# Patient Record
Sex: Female | Born: 1975 | Race: White | Hispanic: No | Marital: Married | Smoking: Former smoker
Health system: Southern US, Community
[De-identification: ages and names within clinical notes are randomized; demographics above are authoritative.]

## PROBLEM LIST (undated history)

## (undated) DIAGNOSIS — F419 Anxiety disorder, unspecified: Secondary | ICD-10-CM

## (undated) DIAGNOSIS — F988 Other specified behavioral and emotional disorders with onset usually occurring in childhood and adolescence: Secondary | ICD-10-CM

## (undated) HISTORY — DX: Anxiety disorder, unspecified: F41.9

## (undated) HISTORY — PX: OTHER SURGICAL HISTORY: SHX169

## (undated) HISTORY — DX: Other specified behavioral and emotional disorders with onset usually occurring in childhood and adolescence: F98.8

---

## 2009-01-21 ENCOUNTER — Encounter: Admission: RE | Admit: 2009-01-21 | Discharge: 2009-01-21 | Payer: Self-pay | Admitting: Family Medicine

## 2013-02-08 ENCOUNTER — Other Ambulatory Visit (HOSPITAL_COMMUNITY)
Admission: RE | Admit: 2013-02-08 | Discharge: 2013-02-08 | Disposition: A | Discharge status: Home / Self Care | Payer: BC Managed Care – PPO | Source: Ambulatory Visit | Attending: Internal Medicine | Admitting: Internal Medicine

## 2013-02-08 DIAGNOSIS — Z01419 Encounter for gynecological examination (general) (routine) without abnormal findings: Secondary | ICD-10-CM | POA: Insufficient documentation

## 2014-01-29 ENCOUNTER — Ambulatory Visit (INDEPENDENT_AMBULATORY_CARE_PROVIDER_SITE_OTHER): Payer: BC Managed Care – PPO | Admitting: Physician Assistant

## 2014-01-29 ENCOUNTER — Ambulatory Visit: Payer: BC Managed Care – PPO

## 2014-01-29 VITALS — BP 124/80 | HR 64 | Temp 98.5°F | Resp 16 | Ht 61.25 in | Wt 111.8 lb

## 2014-01-29 DIAGNOSIS — S61419A Laceration without foreign body of unspecified hand, initial encounter: Secondary | ICD-10-CM

## 2014-01-29 DIAGNOSIS — M79609 Pain in unspecified limb: Secondary | ICD-10-CM

## 2014-01-29 DIAGNOSIS — Z23 Encounter for immunization: Secondary | ICD-10-CM

## 2014-01-29 DIAGNOSIS — S61219A Laceration without foreign body of unspecified finger without damage to nail, initial encounter: Secondary | ICD-10-CM

## 2014-01-29 DIAGNOSIS — S61409A Unspecified open wound of unspecified hand, initial encounter: Secondary | ICD-10-CM

## 2014-01-29 DIAGNOSIS — M79643 Pain in unspecified hand: Secondary | ICD-10-CM

## 2014-01-29 MED ORDER — CEPHALEXIN 500 MG PO CAPS: 500.0000 mg | ORAL_CAPSULE | Freq: Three times a day (TID) | ORAL | Status: DC

## 2014-01-29 MED ORDER — TRAMADOL HCL 50 MG PO TABS
50.0000 mg | ORAL_TABLET | Freq: Three times a day (TID) | ORAL | Status: DC | PRN
Start: 2014-01-29 — End: 2021-02-16

## 2014-01-29 NOTE — Progress Notes (Signed)
Subjective:    Patient ID: Sheri Travis, female    DOB: 12/01/1976, 38 y.o.   MRN: [id number]  HPI Primary Physician: No primary provider on file.  Chief Complaint: Laceration left hand  HPI: 38 y.o. female with history below presents with multiple lacerations to the left hand. Patient was home brewing around 2:30 PM this afternoon when she slightly stumbled forward causing the glass to hit a counter and shatter. Upon this happening she suffered multiple lacerations along the left hand/digits. She originally thought she could get by without having to have sutures placed. She also considered placing glue on the wounds herself, however she could not get the wound on her on her 5th digit to stop bleeding prompting her to come in for evaluation. She did dress the wounds herself quite well. She is uncertain when her last tetanus vaccine was.    History reviewed. No pertinent past medical history.   Home Meds: Prior to Admission medications   Medication Sig Start Date End Date Taking? Authorizing Provider  methylphenidate (CONCERTA) 27 MG CR tablet Take 27 mg by mouth every morning.   Yes Historical Provider, MD    Allergies: No Known Allergies  History   Social History  . Marital Status: Married    Spouse Name: N/A    Number of Children: N/A  . Years of Education: N/A   Occupational History  . Not on file.   Social History Main Topics  . Smoking status: Current Some Day Smoker  . Smokeless tobacco: Not on file  . Alcohol Use: 1.8 oz/week    3 Glasses of wine per week  . Drug Use: No  . Sexual Activity: Yes   Other Topics Concern  . Not on file   Social History Narrative  . No narrative on file     Review of Systems  Constitutional: Negative for fatigue.  Skin: Positive for wound. Negative for color change, pallor and rash.       Objective:   Physical Exam  Physical Exam: Blood pressure 124/80, pulse 64, temperature 98.5 F (36.9 C), temperature source  Oral, resp. rate 16, height 5' 1.25" (1.556 m), weight 111 lb 12.8 oz (50.712 kg), last menstrual period 01/14/2014, SpO2 100.00%., Body mass index is 20.95 kg/(m^2). General: Well developed, well nourished, in no acute distress. Head: Normocephalic, atraumatic, eyes without discharge, sclera non-icteric, nares are without discharge.    Neck: Supple. Full ROM.  Lungs: Breathing is unlabored. Heart: Regular rate. Msk:  Strength and tone normal for age. Extremities/Skin: Warm and dry. No clubbing or cyanosis. No edema. No rashes. Left hand: multiple lacerations. Along the lateral palmer surface there is a 2 cm superficial laceration. Along the distal aspect of the 3rd digit there is a 1 cm superficial laceration. Along the extensor surface of the 3rd digit there is a 1 cm superficial laceration. Along the base of the 5th digit on the flexor surface there is a 0.5 cm superficial laceration. 3rd digit: 2 cm laceration that is a flap. Wound starts midline at the base of the 3rd digit and extends laterally towards the PIP. 5th digit: 1 cm laceration slightly proximal to the nail on the extensor surface. There is a 2 cm laceration along the flexor surface of the 5th digit distal to the PIP along the lateral aspect of the digit. She does have full flexion and extension of all digits. She has strong resisted flexion and extension of all digits. She has equal sensation throughout in all  affected digits. Capillary refill is less than 2 seconds in all affected digits. There are no wounds on her head including bruising, erythema, swelling, or lacerations.  Neuro: Alert and oriented X 3. Moves all extremities spontaneously. Gait is normal. CNII-XII grossly in tact. Psych:  Responds to questions appropriately with a normal affect.   Left hand:  [facility] reading (PRIMARY) by  Dr. Katrinka Blazing. Negative for fracture. No FB.    PROCEDURE NOTE: Verbal consent obtained.  Risks and benefits of the procedure were  explained. Patient made an informed decision to proceed with the procedure. Sterile technique employed. Numbing: Anesthesia obtained with 1) 3rd digit 2% plain lidocaine with 0.5% Marcaine 1:1 ratio 2 cc digital block. 2) 5th digit 2% plain lidocaine with 5% Marcaine 1:1 ratio 2 cc digital block. 0.5 cc of 2% plain lidocaine added to volar surface wound at later time for further anesthesia.   Cleansed with soap and water. Irrigated. Betadine prep per usual protocol.  Wound explored, no deep structures involved, no foreign bodies including glass fragments.  I do not palpate any foreign bodies.  Wound along the flexor surface of the 3rd digit is flap and does not extend to deep structures.  Penrose was applied to the digit for 2 minutes and removed. Wound along the flexor surface of the 5th digit does not extend to deep structures.  Wound repairs: 1) 3rd digit: 6 simple interrupted sutures of 5-0 Prolene.  2) Flexor surface of the 5th digit: 3 simple interrupted sutures of 5-0 Prolene.  3) Extensor surface of the 5th digit: 3 simple interrupted sutures of 5-0 Prolene.  4) Dermabond applied to superficial wound along the lateral palm, base of 5th digit along the flexor surface, and along the distal aspect of the 3rd digit. Hemostasis obtained. Wounds cleansed and dressed.  Wound care instructions including precautions covered with patient. Handout given.  Anticipate suture removal in 10 days.       Assessment & Plan:  38 year old female with multiple lacerations along the left hand/digits  -Status post primary repair per above -There are both lacerations that require suturing and lacerations that are superficial  -Keflex 500 mg 1 po tid #30 no RF, given the number of wounds and the duration of her wounds being open prior to primary closure  -Wound care -Wound x-rayed and explored for foreign body -No deep structures involved -Wound care -Suture removal 10 days -TDaP -Ultram 50 mg 1 po  tid prn pain #30 no RF, SED   Eula Listen, MHS, PA-C [facility] [address] [place], [place] [address] [phone] [facility] 01/29/2014 6:47 PM

## 2014-02-08 ENCOUNTER — Ambulatory Visit (INDEPENDENT_AMBULATORY_CARE_PROVIDER_SITE_OTHER): Payer: BC Managed Care – PPO | Admitting: Physician Assistant

## 2014-02-08 VITALS — BP 132/86 | HR 72 | Temp 99.5°F | Resp 16 | Ht 61.5 in | Wt 113.6 lb

## 2014-02-08 DIAGNOSIS — Z4802 Encounter for removal of sutures: Secondary | ICD-10-CM

## 2014-02-08 NOTE — Progress Notes (Signed)
   Patient ID: Sheri Travis MRN: [id number], DOB: 08/26/1976 38 y.o. Date of Encounter: 02/08/2014, 1:51 PM  Primary Physician: No primary provider on file.  Chief Complaint: Suture removal    See note from 01/29/14  HPI: 38 y.o. female with injury to left 3rd and 5th digits Here for suture removal s/p placement on 01/29/14 Doing well Afebrile/ No chills No erythema No pain Able to move without difficulty Normal sensation at baseline She does mention when the weather is cold the digits will go numb until she pulls on them  No past medical history on file.   Home Meds: Prior to Admission medications   Medication Sig Start Date End Date Taking? Authorizing Provider  cephALEXin (KEFLEX) 500 MG capsule Take 1 capsule (500 mg total) by mouth 3 (three) times daily. 01/29/14  Yes Leone Putman M Anastyn Ayars, PA-C  methylphenidate (CONCERTA) 27 MG CR tablet Take 27 mg by mouth every morning.   Yes Historical Provider, MD  traMADol (ULTRAM) 50 MG tablet Take 1 tablet (50 mg total) by mouth 3 (three) times daily as needed. 01/29/14  Yes Donda Friedli M Dacotah Cabello, PA-C    Allergies: No Known Allergies  Physical Exam: Blood pressure 132/86, pulse 72, temperature 99.5 F (37.5 C), temperature source Oral, resp. rate 16, height 5' 1.5" (1.562 m), weight 113 lb 9.6 oz (51.529 kg), last menstrual period 01/14/2014, SpO2 99.00%., Body mass index is 21.12 kg/(m^2). General: Well developed, well nourished, in no acute distress. Head: Normocephalic, atraumatic, sclera non-icteric, no xanthomas, nares are without discharge.  Neck: Supple. Lungs: Breathing is unlabored. Heart: Normal rate. Msk:  Strength and tone appear normal for age. Wound: Wounds well healed without erythema, swelling, or tenderness to palpation. FROM and 5/5 strength with normal sensation throughout including 2 point discrimination tested with paper clip Skin: See above, otherwise dry without rash or erythema. Extremities: No clubbing or cyanosis.  No edema. Neuro: Alert and oriented X 3. Moves all extremities spontaneously.  Psych:  Responds to questions appropriately with a normal affect.   PROCEDURE: Verbal consent obtained. 6 sutures removed from 3rd digit, 3 sutures removed from volar aspect of 5th digit, and 3 sutures removed from dorsal aspect of 5th digit without difficulty.  Assessment and Plan: 38 y.o. female here for suture removal for wound described above. -Sutures removed per above -Wound resolved -If on and off numbness persists when digits get cold RTC, patient agrees -She declines referral for further evaluation at this time -RTC prn  Signed, Eula Listenyan Elora Wolter, MHS, PA-C [facility] [place], [place] [id number] [phone] [facility] 02/08/2014 1:51 PM

## 2014-10-25 ENCOUNTER — Other Ambulatory Visit: Payer: Self-pay | Admitting: Internal Medicine

## 2014-10-25 DIAGNOSIS — N6320 Unspecified lump in the left breast, unspecified quadrant: Secondary | ICD-10-CM

## 2014-11-11 ENCOUNTER — Ambulatory Visit
Admission: RE | Admit: 2014-11-11 | Discharge: 2014-11-11 | Disposition: A | Discharge status: Home / Self Care | Payer: BC Managed Care – PPO | Source: Ambulatory Visit | Attending: Internal Medicine | Admitting: Internal Medicine

## 2014-11-11 DIAGNOSIS — N6320 Unspecified lump in the left breast, unspecified quadrant: Secondary | ICD-10-CM

## 2015-12-31 IMAGING — CR DG HAND COMPLETE 3+V*L*
2 series · 2 of 2 positions shown · non-contrast
Comparison: None.

CLINICAL DATA: Laceration to middle fingers.

EXAM:
LEFT HAND - COMPLETE 3+ VIEW

[PA]
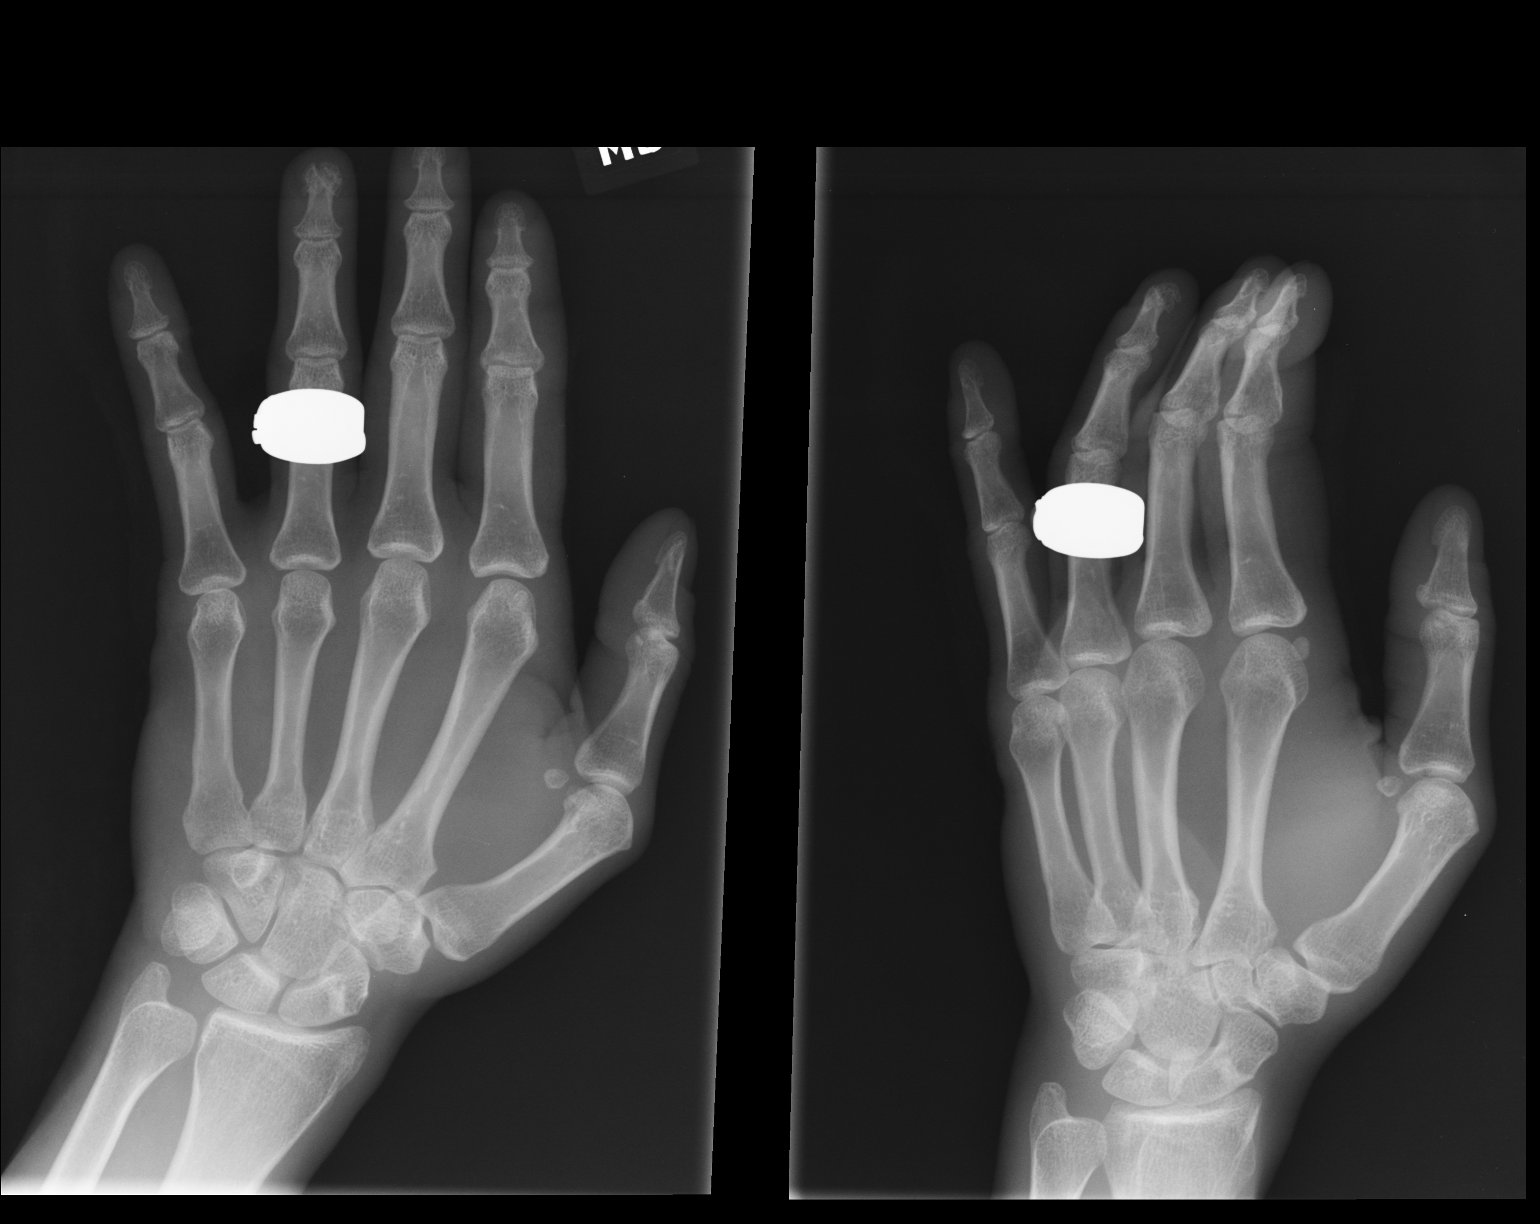

[lateral]
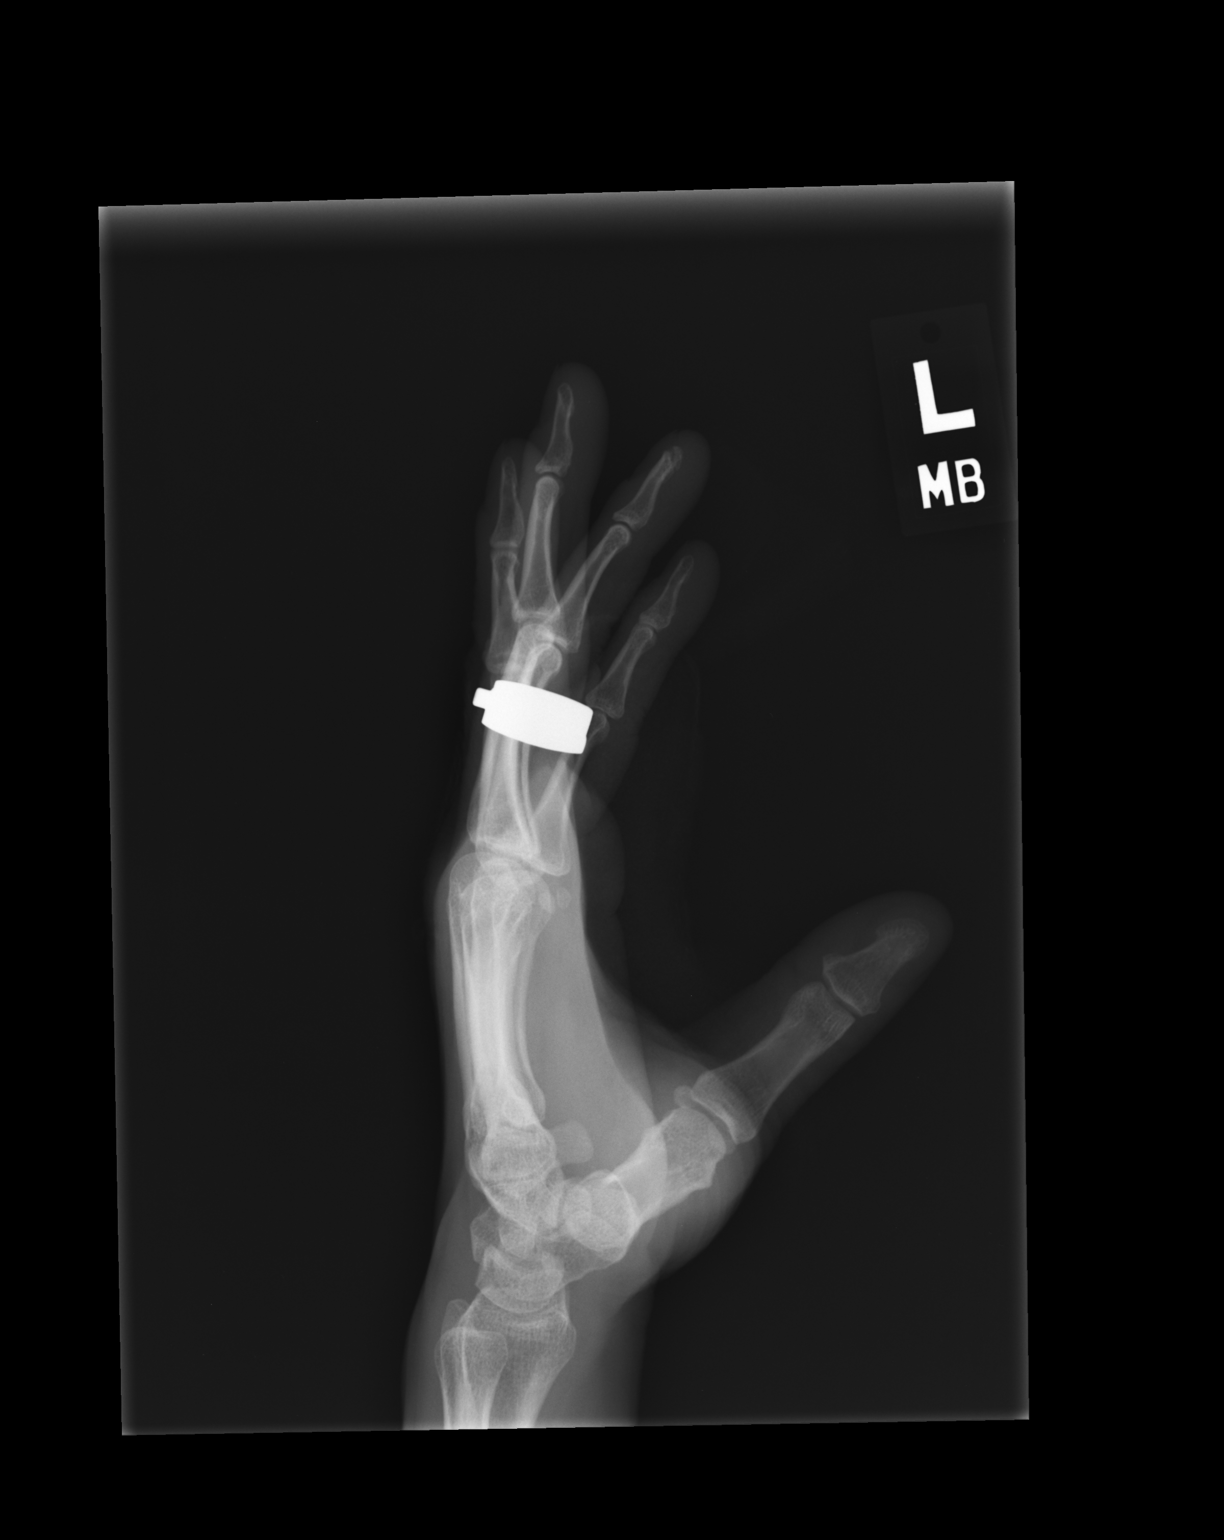

[2 of 2 positions shown; findings below may reference images not displayed]

FINDINGS: There is no evidence of acute fracture, subluxation or dislocation.

No unexpected radiopaque foreign bodies are identified.

Deformity of the ring finger tuft appears chronic.

No focal bony lesions otherwise noted.
IMPRESSION: No acute bony abnormality.

## 2016-09-22 ENCOUNTER — Encounter: Payer: Self-pay | Admitting: Sports Medicine

## 2016-09-22 ENCOUNTER — Ambulatory Visit (INDEPENDENT_AMBULATORY_CARE_PROVIDER_SITE_OTHER): Payer: BC Managed Care – PPO | Admitting: Sports Medicine

## 2016-09-22 VITALS — BP 141/67 | Ht 61.0 in | Wt 124.0 lb

## 2016-09-22 DIAGNOSIS — M25552 Pain in left hip: Secondary | ICD-10-CM | POA: Insufficient documentation

## 2016-09-22 DIAGNOSIS — M79605 Pain in left leg: Secondary | ICD-10-CM

## 2016-09-22 MED ORDER — GABAPENTIN 100 MG PO CAPS: 100.0000 mg | ORAL_CAPSULE | Freq: Every day | ORAL | 1 refills | Status: DC

## 2016-09-22 NOTE — Assessment & Plan Note (Addendum)
Pain is possible for component of piriformis syndrome versus sciatica from her back. She does have weak hip abduction which could contribute to the onset of this problem. She also has bilateral accessory navicular bones that could result in pronation contributing as well. - Initiate gabapentin 100 mg daily at bedtime - Placed in green insoles size 6-7 with a small scaphoid pad bilaterally.  - Provided home modalities sheet with exercises for strengthening. - She will follow-up in 6 weeks to monitor for improvement.

## 2016-09-22 NOTE — Progress Notes (Signed)
  Sheri KaufmannMelissa Travis - 40 y.o. female MRN [id number]  Date of birth: 12/13/1975  SUBJECTIVE:  Including CC & ROS.  Chief Complaint  Patient presents with  . Leg Pain   [name] is a 40 yo F that is presenting with left lateral hip pain. This pain started about 2 weeks ago with no inciting event. She notes that there is some radiation down the lateral aspect of her left leg to her ankle. She initially made the appointment for medial foot pain but that has resolved. This pain is worse with any prolonged sitting. She tried ibuprofen and mobic with minimal help. She has not tried any new running gait or path. She typically runs 5 days a week for a duration of 6-8 miles. She has not run recently and her pain has had some improvement. She denies any injury or surgery over this area.  ROS: No unexpected weight loss, fever, chills, swelling, instability, numbness/tingling, redness, otherwise see HPI    HISTORY: Past Medical, Surgical, Social, and Family History Reviewed & Updated per EMR.   Pertinent Historical Findings include: PMSHx -  None  PSHx -  Former smoker. Occasional alcohol. Health educator.  FHx -  HTN, HLD Medications - vitam  DATA REVIEWED: None   PHYSICAL EXAM:  VS: BP:(!) 141/67  HR: bpm  TEMP: ( )  RESP:   HT:5\' 1"  (154.9 cm)   WT:124 lb (56.2 kg)  BMI:23.5 PHYSICAL EXAM: Gen: NAD, alert, cooperative with exam, well-appearing HEENT: clear conjunctiva, EOMI CV:  no edema, capillary refill brisk,  Resp: non-labored, normal speech Skin: no rashes, normal turgor  Neuro: no gross deficits.  Psych:  alert and oriented Back Exam:  No tenderness to palpation over the greater trochanter bilaterally. No tenderness to palpation over the SI joints bilaterally. No tenderness to palpation over the lumbar spine. palpation of the paraspinal muscles. Weak hip abduction on the left. No leg length discrepancy Able to flex to 90. Able to extend roughly 10. Normal hip  flexion bilaterally. Normal strength with knee extension and flexion. Positive straight leg raise on the left. Negative straight leg raise on the right. Negative FADIR and FABER b/l  Neurovascularly intact Foot:  Obvious hallux valgus bilaterally. Accessory navicular bone bilaterally. Gait:  Mild intoeing but otherwise efficient gait  Correction with green insoles brings her back to more of a neutral plane   ASSESSMENT & PLAN:   Pain in lateral left lower extremity Pain is possible for component of piriformis syndrome versus sciatica from her back. She does have weak hip abduction which could contribute to the onset of this problem. She also has bilateral accessory navicular bones that could result in pronation contributing as well. - Initiate gabapentin 100 mg daily at bedtime - Placed in green insoles size 6-7 with a small scaphoid pad bilaterally.  - Provided home modalities sheet with exercises for strengthening. - She will follow-up in 6 weeks to monitor for improvement.

## 2016-11-03 ENCOUNTER — Encounter: Payer: Self-pay | Admitting: Sports Medicine

## 2016-11-03 ENCOUNTER — Ambulatory Visit (INDEPENDENT_AMBULATORY_CARE_PROVIDER_SITE_OTHER): Payer: BC Managed Care – PPO | Admitting: Sports Medicine

## 2016-11-03 ENCOUNTER — Ambulatory Visit: Payer: Self-pay

## 2016-11-03 VITALS — BP 130/80

## 2016-11-03 DIAGNOSIS — Q6689 Other  specified congenital deformities of feet: Secondary | ICD-10-CM

## 2016-11-03 DIAGNOSIS — M79672 Pain in left foot: Secondary | ICD-10-CM | POA: Diagnosis not present

## 2016-11-03 DIAGNOSIS — M79605 Pain in left leg: Secondary | ICD-10-CM

## 2016-11-03 DIAGNOSIS — M25552 Pain in left hip: Secondary | ICD-10-CM

## 2016-11-03 DIAGNOSIS — Q742 Other congenital malformations of lower limb(s), including pelvic girdle: Principal | ICD-10-CM

## 2016-11-03 NOTE — Progress Notes (Signed)
Sheri KaufmannMelissa Travis - 40 y.o. female MRN [id number]  Date of birth: 04/21/1976  SUBJECTIVE:  Including CC & ROS.   Ms. Sheri Travis is a 40 yo F that is following up for her left hip pain and left foot pain. She has been performing home exercises for her hip and has noticed an improvemtn. He hasn't been able to perform as many while over thanksgiving and has noticed a return of some of her pain. She denies any pain at night. She took the gabapentin for two nights and stopped it after reading the side effects. She feels as though this pain now is similar to the pain that she felt before. She has cut down on her running significantly since having pain in her hip and foot. She would normally run about 6-8 miles per day for 5 days a week.   She is also having ongoing left foot pain that is occurring on the navicular bone. She noticed the pain at rest and with running. She is have to tolerate the pain but most of the pain occurs when she has finished running. She has taken ibuprofen for the pain.   HISTORY: Past Medical, Surgical, Social, and Family History Reviewed & Updated per EMR.   Pertinent Historical Findings include: PSHx -  Former smoker   DATA REVIEWED: None   PHYSICAL EXAM:  VS: BP:130/80  HR: bpm  TEMP: ( )  RESP:   HT:    WT:   BMI:  PHYSICAL EXAM: Gen: NAD, alert, cooperative with exam, well-appearing HEENT: clear conjunctiva, EOMI CV:  no edema, capillary refill brisk,  Resp: non-labored, normal speech Skin: no rashes, normal turgor  Neuro: no gross deficits.  Psych:  alert and oriented Left foot:  TTP of the navicular  No overlying swelling or ecchymosis  Normal ankle rom  Normal strength to resistance in all directions.  Significant hallux valgus  Normal posterior tib function with standing on tip toes  Hip:  No TTP of the GT Normal hip flexion  Normal IR and ER  Normal knee flexion and extension  Still weakness with hip abduction on left but symmetrical    Limited US: left foot: The appears to be a calcified connection between the accessory navicular and the navicular. The posterior tib was normal in short axis at the tarsal tunnel. It was follow obliquely and was normal in long axis. Its attachment to the accessory navicular was evident for significant calcified debris in this area. This was compared to the right side which was evident for accessory navicular but had less calcified debris.   Findings consistent with a stress reaction of the accessory navicular on the left foot.    ASSESSMENT & PLAN:   Left hip pain She has had improvement of her pain if she is able to perform the exercises on a regular basis.  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 She has significant hallux valgus as well. The posterior tib appears to be normal.  - orthotics today  - she will f/u in 6 weeks and if no improvement could try injection vs nitro.   Patient was fitted for a standard, cushioned, semi-rigid orthotic. The orthotic was heated and afterward the patient stood on the orthotic blank positioned on the orthotic stand. The patient was positioned in subtalar neutral position and 10 degrees of ankle dorsiflexion in a weight bearing stance. After completion  of molding, a stable base was applied to the orthotic blank. The blank was ground to a stable position for weight bearing. Size: 6 Base: Blue EVA Additional Posting and Padding: bilateral medial heel wedge.  The patient ambulated these, and they were very comfortable.  I spent 40 minutes with this patient, greater than 50% was face-to-face time counseling regarding the below diagnosis.

## 2016-11-04 DIAGNOSIS — Q742 Other congenital malformations of lower limb(s), including pelvic girdle: Principal | ICD-10-CM | POA: Insufficient documentation

## 2016-11-04 DIAGNOSIS — M79672 Pain in left foot: Secondary | ICD-10-CM | POA: Insufficient documentation

## 2016-11-04 NOTE — Assessment & Plan Note (Addendum)
She has had improvement of her pain if she is able to perform the exercises on a regular basis.  - continue HEP

## 2016-11-04 NOTE — Assessment & Plan Note (Signed)
The accessory navicular has changes of breakdown of a stress change to the area. She has significant hallux valgus as well. The posterior tib appears to be normal.  - orthotics today  - she will f/u in 6 weeks and if no improvement could try injection vs nitro.   Patient was fitted for a standard, cushioned, semi-rigid orthotic. The orthotic was heated and afterward the patient stood on the orthotic blank positioned on the orthotic stand. The patient was positioned in subtalar neutral position and 10 degrees of ankle dorsiflexion in a weight bearing stance. After completion of molding, a stable base was applied to the orthotic blank. The blank was ground to a stable position for weight bearing. Size: 6 Base: Blue EVA Additional Posting and Padding: bilateral medial heel wedge.  The patient ambulated these, and they were very comfortable.  I spent 40 minutes with this patient, greater than 50% was face-to-face time counseling regarding the below diagnosis.

## 2016-12-15 ENCOUNTER — Ambulatory Visit (INDEPENDENT_AMBULATORY_CARE_PROVIDER_SITE_OTHER): Payer: BC Managed Care – PPO | Admitting: Sports Medicine

## 2016-12-15 ENCOUNTER — Encounter: Payer: Self-pay | Admitting: Sports Medicine

## 2016-12-15 DIAGNOSIS — Q742 Other congenital malformations of lower limb(s), including pelvic girdle: Principal | ICD-10-CM

## 2016-12-15 DIAGNOSIS — Q6689 Other  specified congenital deformities of feet: Secondary | ICD-10-CM | POA: Diagnosis not present

## 2016-12-15 DIAGNOSIS — M79672 Pain in left foot: Secondary | ICD-10-CM

## 2016-12-15 MED ORDER — NITROGLYCERIN 0.2 MG/HR TD PT24: MEDICATED_PATCH | TRANSDERMAL | 1 refills | Status: DC

## 2016-12-15 NOTE — Patient Instructions (Signed)

## 2016-12-16 NOTE — Progress Notes (Signed)
  Sheri Travis - 41 y.o. female MRN [id number]  Date of birth: 06/30/1976  SUBJECTIVE:  Including CC & ROS.   Sheri Travis is a 41 year old female that is following up for her left foot pain. She is having pain on the left navicular upon running. She custom orthotics and modifications placed has had little improvement. She took a week off and had minimal improvement. She denies any radiation of the pain. She denies any pain upon rubbing against her shoe. She is not been taking any medications.  ROS: No unexpected weight loss, fever, chills, swelling, instability, muscle pain, numbness/tingling, redness, otherwise see HPI    HISTORY: Past Medical, Surgical, Social, and Family History Reviewed & Updated per EMR.   Pertinent Historical Findings include: PSHx -  Former smoker   DATA REVIEWED: none  PHYSICAL EXAM:  VS: BP:116/76  HR: bpm  TEMP: ( )  RESP:   HT:5\' 1"  (154.9 cm)   WT:124 lb (56.2 kg)  BMI:23.5 PHYSICAL EXAM: Gen: NAD, alert, cooperative with exam, well-appearing HEENT: clear conjunctiva, EOMI CV:  no edema, capillary refill brisk,  Resp: non-labored, normal speech Skin: no rashes, normal turgor  Neuro: no gross deficits.  Psych:  alert and oriented Left Foot:  Significant hallux valgus. Accessory navicular bone observe. Normal ankle range of motion. Normal strength to resistance in all range of motion. Significant tenderness to palpation over accessory navicular. No obvious effusion. MRIs to her tiptoes. Neurologically intact.  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 A small scaphoid was placed upon her orthotic on the left. She is wanting to avoid injections if possible. Nitroglycerin protocol was initiated today. She'll follow-up in 6 weeks or sooner if needed.

## 2016-12-16 NOTE — Assessment & Plan Note (Signed)
She is still having significant pain with the custom orthotics. A small scaphoid was placed upon her orthotic on the left. She is wanting to avoid injections if possible. Nitroglycerin protocol was initiated today. She'll follow-up in 6 weeks or sooner if needed.

## 2017-02-03 ENCOUNTER — Ambulatory Visit: Payer: BC Managed Care – PPO | Admitting: Sports Medicine

## 2017-03-17 ENCOUNTER — Ambulatory Visit: Payer: BC Managed Care – PPO | Admitting: Sports Medicine

## 2017-04-13 ENCOUNTER — Ambulatory Visit (INDEPENDENT_AMBULATORY_CARE_PROVIDER_SITE_OTHER): Payer: BC Managed Care – PPO | Admitting: Sports Medicine

## 2017-04-13 DIAGNOSIS — M21612 Bunion of left foot: Secondary | ICD-10-CM | POA: Diagnosis not present

## 2017-04-13 DIAGNOSIS — M21611 Bunion of right foot: Secondary | ICD-10-CM | POA: Insufficient documentation

## 2017-04-13 DIAGNOSIS — M79672 Pain in left foot: Secondary | ICD-10-CM | POA: Diagnosis not present

## 2017-04-13 DIAGNOSIS — Q742 Other congenital malformations of lower limb(s), including pelvic girdle: Secondary | ICD-10-CM | POA: Diagnosis not present

## 2017-04-13 NOTE — Progress Notes (Signed)
CC: follow up of foot pain  Patient noted with post tib tendinopathy on last visit Started on NTG Had pain relief within a few days  Has bunions, first MT insufficiency and accessory naviculars RT foot with more pronation We made custom orthotics and able to RT running  Now back to 30 MPW  ROS No pain over bunions No redness or swelling of MTP 1 No side effects with NTG  PE Muscular F in NAD BP 109/69   Ht 5\' 1"  (1.549 m)   Wt 124 lb (56.2 kg)   BMI 23.43 kg/m   Bunions R and L with at least 30 deg valgus shift Bilat pes planus Accessory navicular noted bilat RT foot pronation with turnout of rt foot by 15 deg  Note with orthoics we have a nice correction of foot position Control of pronation on left and almost neutral on RT

## 2017-04-13 NOTE — Assessment & Plan Note (Signed)
Pain resolved with use of NTG and orthoitcs  Scaphoid pad added to RT to support navicular position  Reck prn

## 2017-04-13 NOTE — Assessment & Plan Note (Signed)
Bunion pads added to orthoitcs

## 2017-04-14 ENCOUNTER — Ambulatory Visit: Payer: BC Managed Care – PPO | Admitting: Sports Medicine

## 2017-05-11 ENCOUNTER — Other Ambulatory Visit: Payer: Self-pay | Admitting: Sports Medicine

## 2018-12-20 ENCOUNTER — Ambulatory Visit (HOSPITAL_COMMUNITY)
Admission: EM | Admit: 2018-12-20 | Discharge: 2018-12-20 | Disposition: A | Discharge status: Home / Self Care | Payer: BC Managed Care – PPO | Attending: Family Medicine | Admitting: Family Medicine

## 2018-12-20 ENCOUNTER — Encounter (HOSPITAL_COMMUNITY): Payer: Self-pay

## 2018-12-20 DIAGNOSIS — T7840XA Allergy, unspecified, initial encounter: Secondary | ICD-10-CM | POA: Diagnosis not present

## 2018-12-20 MED ORDER — PREDNISONE 10 MG (21) PO TBPK: ORAL_TABLET | Freq: Every day | ORAL | 0 refills | Status: DC

## 2018-12-20 NOTE — Discharge Instructions (Addendum)
You may continue to take Benadryl if needed. °

## 2018-12-20 NOTE — ED Provider Notes (Signed)
[facility]   [id number] 12/20/18 Arrival Time: 1336  ASSESSMENT & PLAN:  1. Allergic reaction, initial encounter    Unknown trigger. No signs of anaphylaxis.  Meds ordered this encounter  Medications  . predniSONE (STERAPRED UNI-PAK 21 TAB) 10 MG (21) TBPK tablet    Sig: Take by mouth daily. Take as directed.    Dispense:  21 tablet    Refill:  0   Continue Benadryl if needed. To ED with any worsening.  Reviewed expectations re: course of current medical issues. Questions answered. Outlined signs and symptoms indicating need for more acute intervention. Patient verbalized understanding. After Visit Summary given.   SUBJECTIVE: History from: patient. Sheri Travis is a 43 y.o. female who presents for evaluation of a possible allergic reaction. Possible triggers: have not been identified. Reports a rash and itching of her face; "lips feel swollen". Reports abrupt beginning today. Clinical course: stable. Denies chest pain, shortness of breath and wheezing. No swallowing difficulties. No similar previous reactions reported. Patient denies exposure to new medications or allergens. Care prior to arrival consisted of Benadryl, with mild relief.  ROS: As per HPI. All other systems negative   OBJECTIVE:  Vitals:   12/20/18 1430  BP: 136/74  Pulse: (!) 57  Resp: 18  Temp: 98.9 F (37.2 C)  TempSrc: Oral  SpO2: 100%   General appearance: alert; no distress Eyes: PERRLA; EOMI; conjunctiva normal HENT: normocephalic; atraumatic; nasal mucosa normal; oral mucosa normal without lesions; no angioedema Neck: supple without LAD Lungs: clear to auscultation bilaterally; unlabored respirations; able to speak in full sentences; no wheezing Heart: regular rate and rhythm Abdomen: soft, non-tender Back: no CVA tenderness Extremities: no cyanosis or edema; symmetrical with no gross deformities Skin: warm and dry; smooth, few areas of slightly elevated and  erythematous plaques of variable size over her face and neck Neurologic: normal gait Psychological: alert and cooperative; normal mood and affect  No Known Allergies  PMH: Bilateral bunions.  Social History   Socioeconomic History  . Marital status: Married    Spouse name: Not on file  . Number of children: Not on file  . Years of education: Not on file  . Highest education level: Not on file  Occupational History  . Not on file  Social Needs  . Financial resource strain: Not on file  . Food insecurity:    Worry: Not on file    Inability: Not on file  . Transportation needs:    Medical: Not on file    Non-medical: Not on file  Tobacco Use  . Smoking status: Current Some Day Smoker  . Smokeless tobacco: Never Used  Substance and Sexual Activity  . Alcohol use: Yes    Alcohol/week: 3.0 standard drinks    Types: 3 Glasses of wine per week  . Drug use: No  . Sexual activity: Yes  Lifestyle  . Physical activity:    Days per week: Not on file    Minutes per session: Not on file  . Stress: Not on file  Relationships  . Social connections:    Talks on phone: Not on file    Gets together: Not on file    Attends religious service: Not on file    Active member of club or organization: Not on file    Attends meetings of clubs or organizations: Not on file    Relationship status: Not on file  . Intimate partner violence:    Fear of current or ex partner:  Not on file    Emotionally abused: Not on file    Physically abused: Not on file    Forced sexual activity: Not on file  Other Topics Concern  . Not on file  Social History Narrative  . Not on file   Family History  Problem Relation Age of Onset  . Hyperlipidemia Mother   . Hypertension Mother   . Heart disease Mother   . Hyperlipidemia Father   . Aneurysm Maternal Grandmother   . Diabetes Maternal Grandfather   . Diabetes Paternal Grandmother   . Heart attack Paternal Grandmother   . Alzheimer's disease  Paternal Grandfather    History reviewed. No pertinent surgical history.   Mardella Layman, MD 12/28/18 1124

## 2018-12-20 NOTE — ED Triage Notes (Signed)
Pt presents with allergic reaction to face; facial swelling, lip & neck swelling and pain.

## 2019-12-15 ENCOUNTER — Other Ambulatory Visit: Payer: Self-pay

## 2019-12-15 DIAGNOSIS — Z20822 Contact with and (suspected) exposure to covid-19: Secondary | ICD-10-CM

## 2019-12-16 LAB — NOVEL CORONAVIRUS, NAA: SARS-CoV-2, NAA: NOT DETECTED

## 2021-02-16 ENCOUNTER — Other Ambulatory Visit: Payer: Self-pay

## 2021-02-16 ENCOUNTER — Ambulatory Visit (AMBULATORY_SURGERY_CENTER): Payer: Self-pay | Admitting: *Deleted

## 2021-02-16 VITALS — Ht 61.0 in | Wt 130.0 lb

## 2021-02-16 DIAGNOSIS — Z1211 Encounter for screening for malignant neoplasm of colon: Secondary | ICD-10-CM

## 2021-02-16 NOTE — Progress Notes (Signed)
Patient is here in-person for PV. Patient denies any allergies to eggs or soy. Patient denies any problems with anesthesia/sedation. Patient denies any oxygen use at home. Patient denies taking any diet/weight loss medications or blood thinners. Patient is not being treated for MRSA or C-diff. Patient is aware of our care-partner policy and Covid-19 safety protocol. EMMI education assigned to the patient for the procedure, sent to MyChart.   Patient is fully COVID-19 vaccinated, per patient.    

## 2021-02-17 ENCOUNTER — Encounter: Payer: Self-pay | Admitting: Gastroenterology

## 2021-03-01 ENCOUNTER — Encounter: Payer: Self-pay | Admitting: Certified Registered Nurse Anesthetist

## 2021-03-02 ENCOUNTER — Other Ambulatory Visit: Payer: Self-pay

## 2021-03-02 ENCOUNTER — Ambulatory Visit (AMBULATORY_SURGERY_CENTER): Payer: BC Managed Care – PPO | Admitting: Gastroenterology

## 2021-03-02 ENCOUNTER — Encounter: Payer: Self-pay | Admitting: Gastroenterology

## 2021-03-02 VITALS — BP 143/87 | HR 65 | Temp 96.9°F | Resp 16 | Ht 61.0 in | Wt 130.0 lb

## 2021-03-02 DIAGNOSIS — Z1211 Encounter for screening for malignant neoplasm of colon: Secondary | ICD-10-CM | POA: Diagnosis not present

## 2021-03-02 MED ORDER — SODIUM CHLORIDE 0.9 % IV SOLN: 500.0000 mL | Freq: Once | INTRAVENOUS | Status: DC

## 2021-03-02 NOTE — Progress Notes (Signed)
Pt's states no medical or surgical changes since previsit or office visit. 

## 2021-03-02 NOTE — Progress Notes (Signed)
No problems noted in the recovery room. maw 

## 2021-03-02 NOTE — Patient Instructions (Addendum)
You may resume your current medications today. Repeat screening colonoscopy in 10 years. Please call if any questions or concerns.     YOU HAD AN ENDOSCOPIC PROCEDURE TODAY AT THE [facility]:   Refer to the procedure report that was given to you for any specific questions about what was found during the examination.  If the procedure report does not answer your questions, please call your gastroenterologist to clarify.  If you requested that your care partner not be given the details of your procedure findings, then the procedure report has been included in a sealed envelope for you to review at your convenience later.  YOU SHOULD EXPECT: Some feelings of bloating in the abdomen. Passage of more gas than usual.  Walking can help get rid of the air that was put into your GI tract during the procedure and reduce the bloating. If you had a lower endoscopy (such as a colonoscopy or flexible sigmoidoscopy) you may notice spotting of blood in your stool or on the toilet paper. If you underwent a bowel prep for your procedure, you may not have a normal bowel movement for a few days.  Please Note:  You might notice some irritation and congestion in your nose or some drainage.  This is from the oxygen used during your procedure.  There is no need for concern and it should clear up in a day or so.  SYMPTOMS TO REPORT IMMEDIATELY:  Following lower endoscopy (colonoscopy or flexible sigmoidoscopy):  Excessive amounts of blood in the stool  Significant tenderness or worsening of abdominal pains  Swelling of the abdomen that is new, acute  Fever of 100F or higher  For urgent or emergent issues, a gastroenterologist can be reached at any hour by calling [phone]. Do not use MyChart messaging for urgent concerns.    DIET:  We do recommend a small meal at first, but then you may proceed to your regular diet.  Drink plenty of fluids but you should avoid alcoholic beverages for 24  hours.  ACTIVITY:  You should plan to take it easy for the rest of today and you should NOT DRIVE or use heavy machinery until tomorrow (because of the sedation medicines used during the test).    FOLLOW UP: Our staff will call the number listed on your records 48-72 hours following your procedure to check on you and address any questions or concerns that you may have regarding the information given to you following your procedure. If we do not reach you, we will leave a message.  We will attempt to reach you two times.  During this call, we will ask if you have developed any symptoms of COVID 19. If you develop any symptoms (ie: fever, flu-like symptoms, shortness of breath, cough etc.) before then, please call [phone].  If you test positive for Covid 19 in the 2 weeks post procedure, please call and report this information to us.    If any biopsies were taken you will be contacted by phone or by letter within the next 1-3 weeks.  Please call us at [phone] if you have not heard about the biopsies in 3 weeks.    SIGNATURES/CONFIDENTIALITY: You and/or your care partner have signed paperwork which will be entered into your electronic medical record.  These signatures attest to the fact that that the information above on your After Visit Summary has been reviewed and is understood.  Full responsibility of the confidentiality of this discharge information lies with   lies with you and/or your care-partner.

## 2021-03-02 NOTE — Progress Notes (Signed)
Report given to PACU, vss 

## 2021-03-02 NOTE — Op Note (Signed)
[facility] Patient Name: Sheri Travis Procedure Date: 03/02/2021 10:41 AM MRN: [id number] Endoscopist: Rachael Fee , MD Age: 45 Referring MD:  Date of Birth: [birth year] Gender: Female Account #: [id number] Procedure:                Colonoscopy Indications:              Screening for colorectal malignant neoplasm Medicines:                Monitored Anesthesia Care Procedure:                Pre-Anesthesia Assessment:                           - Prior to the procedure, a History and Physical                            was performed, and patient medications and                            allergies were reviewed. The patient's tolerance of                            previous anesthesia was also reviewed. The risks                            and benefits of the procedure and the sedation                            options and risks were discussed with the patient.                            All questions were answered, and informed consent                            was obtained. Prior Anticoagulants: The patient has                            taken no previous anticoagulant or antiplatelet                            agents. ASA Grade Assessment: II - A patient with                            mild systemic disease. After reviewing the risks                            and benefits, the patient was deemed in                            satisfactory condition to undergo the procedure.                           After obtaining informed consent, the colonoscope  was passed under direct vision. Throughout the                            procedure, the patient's blood pressure, pulse, and                            oxygen saturations were monitored continuously. The                            Olympus PCF-H190DL [id number]) Colonoscope was                            introduced through the anus and advanced to the the                            cecum,  identified by appendiceal orifice and                            ileocecal valve. The colonoscopy was performed                            without difficulty. The patient tolerated the                            procedure well. The quality of the bowel                            preparation was good. The ileocecal valve,                            appendiceal orifice, and rectum were photographed. Scope In: 10:44:08 AM Scope Out: 10:58:31 AM Scope Withdrawal Time: 0 hours 10 minutes 18 seconds  Total Procedure Duration: 0 hours 14 minutes 23 seconds  Findings:                 The entire examined colon appeared normal on direct                            and retroflexion views. Complications:            No immediate complications. Estimated blood loss:                            None. Estimated Blood Loss:     Estimated blood loss: none. Impression:               - The entire examined colon is normal on direct and                            retroflexion views.                           - No polyps or cancers. Recommendation:           - Patient has a contact number available for  emergencies. The signs and symptoms of potential                            delayed complications were discussed with the                            patient. Return to normal activities tomorrow.                            Written discharge instructions were provided to the                            patient.                           - Resume previous diet.                           - Continue present medications.                           - Repeat colonoscopy in 10 years for screening. Rachael Fee, MD 03/02/2021 11:00:37 AM This report has been signed electronically.

## 2021-03-04 ENCOUNTER — Telehealth: Payer: Self-pay

## 2021-03-04 NOTE — Telephone Encounter (Signed)
  Follow up Call-  Call back number 03/02/2021  Post procedure Call Back phone  # [phone]  Permission to leave phone message Yes  Some recent data might be hidden     Patient questions:  Do you have a fever, pain , or abdominal swelling? No. Pain Score  0 *  Have you tolerated food without any problems? Yes.    Have you been able to return to your normal activities? Yes.    Do you have any questions about your discharge instructions: Diet   No. Medications  No. Follow up visit  No.  Do you have questions or concerns about your Care? No.  Actions: * If pain score is 4 or above: No action needed, pain <4.  1. Have you developed a fever since your procedure? no  2.   Have you had an respiratory symptoms (SOB or cough) since your procedure? no  3.   Have you tested positive for COVID 19 since your procedure no  4.   Have you had any family members/close contacts diagnosed with the COVID 19 since your procedure?  no   If yes to any of these questions please route to Laverna Peace, RN and Karlton Lemon, RN

## 2022-04-07 ENCOUNTER — Encounter: Payer: Self-pay | Admitting: Radiology

## 2022-04-09 ENCOUNTER — Ambulatory Visit: Payer: BC Managed Care – PPO | Admitting: Radiology

## 2022-04-09 ENCOUNTER — Encounter: Payer: BC Managed Care – PPO | Admitting: Radiology

## 2022-04-09 ENCOUNTER — Encounter: Payer: Self-pay | Admitting: Radiology

## 2022-04-09 VITALS — BP 158/102 | Ht 60.75 in | Wt 134.0 lb

## 2022-04-09 DIAGNOSIS — Z3041 Encounter for surveillance of contraceptive pills: Secondary | ICD-10-CM

## 2022-04-09 DIAGNOSIS — I159 Secondary hypertension, unspecified: Secondary | ICD-10-CM | POA: Diagnosis not present

## 2022-04-09 DIAGNOSIS — R300 Dysuria: Secondary | ICD-10-CM

## 2022-04-09 MED ORDER — HYDROCHLOROTHIAZIDE 12.5 MG PO CAPS: 12.5000 mg | ORAL_CAPSULE | Freq: Every day | ORAL | 0 refills | Status: DC

## 2022-04-09 MED ORDER — NORETHIN-ETH ESTRAD-FE BIPHAS 1 MG-10 MCG / 10 MCG PO TABS: 1.0000 | ORAL_TABLET | Freq: Every day | ORAL | 1 refills | Status: DC

## 2022-04-09 NOTE — Progress Notes (Signed)
? ? ? ? ?  SUBJECTIVE: Sheri Travis is a 46 y.o. female who complains of urinary frequency, urgency and dysuria x 30 days, without flank pain, fever, chills, or abnormal vaginal discharge or bleeding. Would also like to discuss perimenopausal symptoms (hot flashes, vaginal dryness, dysuria). Patient has been taking Junel Fe 24 (after online visit).  Patient then followed up with her PCP and she was also started on Micronor, taking them together. Her BP has been elevated x 1 year and despite diet and exercise it has not improved.  ? ?OBJECTIVE: Appears well, in no apparent distress.  Vital signs are normal. The abdomen is soft without tenderness, guarding, mass, rebound or organomegaly. No CVA tenderness or inguinal adenopathy noted. Urine dipstick shows positive for protein, positive for leukocytes, and positive for ketones.  Micro exam: 10-20 WBC's per HPF, 0-2 RBC's per HPF, and moderate+ bacteria.  ? ?Chaperone offered and declined for exam. ? ?ASSESSMENT/PLAN:  ? ?1. Dysuria ? ?- Urinalysis,Complete w/RFL Culture ? ?2. Encounter for surveillance of contraceptive pills ?Will start LoLoestrin instead to decrease risk of clotting with estrogen but still control symptoms ? ?- Norethindrone-Ethinyl Estradiol-Fe Biphas (LO LOESTRIN FE) 1 MG-10 MCG / 10 MCG tablet; Take 1 tablet by mouth daily.  Dispense: 84 tablet; Refill: 1 ? ?3. Secondary hypertension ? ?- hydrochlorothiazide (MICROZIDE) 12.5 MG capsule; Take 1 capsule (12.5 mg total) by mouth daily.  Dispense: 90 capsule; Refill: 0  ? ? ?Coconut oil for dryness daily and as a lubricant ?Risks discussed regarding use of OCPs ?Will send urine culture and sensitivity.  ?Treatment per orders - also push fluids, avoid bladder irritants. Instructed she may use Pyridium OTC prn. Call or return to clinic prn if these symptoms worsen or fail to improve as anticipated. Pyelo precautions reviewed with patient.  ?  ?

## 2022-04-12 ENCOUNTER — Other Ambulatory Visit: Payer: Self-pay

## 2022-04-12 LAB — URINALYSIS, COMPLETE W/RFL CULTURE
Bilirubin Urine: NEGATIVE
Casts: NONE SEEN /LPF
Crystals: NONE SEEN /HPF
Glucose, UA: NEGATIVE
Hgb urine dipstick: NEGATIVE
Hyaline Cast: NONE SEEN /LPF
Nitrites, Initial: NEGATIVE
Specific Gravity, Urine: 1.015 (ref 1.001–1.035)
Yeast: NONE SEEN /HPF
pH: 7 (ref 5.0–8.0)

## 2022-04-12 LAB — URINE CULTURE
MICRO NUMBER:: 13357666
SPECIMEN QUALITY:: ADEQUATE

## 2022-04-12 LAB — CULTURE INDICATED

## 2022-04-12 MED ORDER — NITROFURANTOIN MONOHYD MACRO 100 MG PO CAPS: 100.0000 mg | ORAL_CAPSULE | Freq: Two times a day (BID) | ORAL | 0 refills | Status: DC

## 2022-05-21 ENCOUNTER — Ambulatory Visit: Payer: BC Managed Care – PPO | Admitting: Radiology

## 2022-05-21 ENCOUNTER — Encounter: Payer: Self-pay | Admitting: Radiology

## 2022-05-21 VITALS — BP 122/84

## 2022-05-21 DIAGNOSIS — I159 Secondary hypertension, unspecified: Secondary | ICD-10-CM

## 2022-05-21 DIAGNOSIS — Z3041 Encounter for surveillance of contraceptive pills: Secondary | ICD-10-CM

## 2022-05-21 MED ORDER — NORETHIN-ETH ESTRAD-FE BIPHAS 1 MG-10 MCG / 10 MCG PO TABS: 1.0000 | ORAL_TABLET | Freq: Every day | ORAL | 3 refills | Status: DC

## 2022-05-21 MED ORDER — HYDROCHLOROTHIAZIDE 12.5 MG PO CAPS: 12.5000 mg | ORAL_CAPSULE | Freq: Every day | ORAL | 3 refills | Status: AC

## 2022-05-21 NOTE — Progress Notes (Signed)
   Sheri Travis 1976-02-17 [id number]   History: 46 y.o. G2P0 presents for contraceptive follow up after changing OCP from 1/20 pill to 1/10 pill. Doing well, no BTB. She was also started on HCTZ at that visit and BP improved, PCP added Lisinopril.   Gynecologic History No LMP recorded. Contraception: OCP (estrogen/progesterone)   Obstetric History OB History  Gravida Para Term Preterm AB Living  2       2 0  SAB IAB Ectopic Multiple Live Births    2     0    # Outcome Date GA Lbr Len/2nd Weight Sex Delivery Anes PTL Lv  2 IAB           1 IAB              The following portions of the patient's history were reviewed and updated as appropriate: allergies, current medications, past family history, past medical history, past social history, past surgical history, and problem list.  Review of Systems Pertinent items noted in HPI and remainder of comprehensive ROS otherwise negative.  Health Maintenance    Past medical history, past surgical history, family history and social history were all reviewed and documented in the EPIC chart.  ROS:  A ROS was performed and pertinent positives and negatives are included.  Exam:  Vitals:   05/21/22 1346  BP: 122/84   There is no height or weight on file to calculate BMI.   Assessment/Plan:  1. Encounter for surveillance of contraceptive pills  - Norethindrone-Ethinyl Estradiol-Fe Biphas (LO LOESTRIN FE) 1 MG-10 MCG / 10 MCG tablet; Take 1 tablet by mouth daily.  Dispense: 84 tablet; Refill: 3  2. Secondary hypertension Continue to monitor BP HCTZ will be refilled as it was not filled at PCP - hydrochlorothiazide (MICROZIDE) 12.5 MG capsule; Take 1 capsule (12.5 mg total) by mouth daily.  Dispense: 90 capsule; Refill: 3      Sheri Travis, WHNP-BC

## 2022-06-23 ENCOUNTER — Ambulatory Visit (INDEPENDENT_AMBULATORY_CARE_PROVIDER_SITE_OTHER): Payer: BC Managed Care – PPO | Admitting: Radiology

## 2022-06-23 VITALS — BP 152/84

## 2022-06-23 DIAGNOSIS — F419 Anxiety disorder, unspecified: Secondary | ICD-10-CM

## 2022-06-23 DIAGNOSIS — R5383 Other fatigue: Secondary | ICD-10-CM

## 2022-06-23 DIAGNOSIS — I10 Essential (primary) hypertension: Secondary | ICD-10-CM | POA: Diagnosis not present

## 2022-06-23 MED ORDER — FLUOXETINE HCL 10 MG PO CAPS: 10.0000 mg | ORAL_CAPSULE | Freq: Every day | ORAL | 1 refills | Status: DC

## 2022-06-23 NOTE — Progress Notes (Signed)
   Sheri Travis 07/28/1976 [id number]   History:  46 y.o. G2P0 presents with concerns today. Worsening anxiety over the past two weeks. Severe fatigue, slept for 24 hours straight last week. BP has been elevated, no headaches or vision changes. Requests rx for prozac, would like thyroid rechecked.   Obstetric History OB History  Gravida Para Term Preterm AB Living  2       2 0  SAB IAB Ectopic Multiple Live Births    2     0    # Outcome Date GA Lbr Len/2nd Weight Sex Delivery Anes PTL Lv  2 IAB           1 IAB              The following portions of the patient's history were reviewed and updated as appropriate: allergies, current medications, past family history, past medical history, past social history, past surgical history, and problem list.  Review of Systems Pertinent items noted in HPI and remainder of comprehensive ROS otherwise negative.   Past medical history, past surgical history, family history and social history were all reviewed and documented in the EPIC chart.   Exam:  Vitals:   06/23/22 1541  BP: (!) 152/84   There is no height or weight on file to calculate BMI.  General appearance:  Normal, NAD, anxious   Assessment/Plan:   1. Anxiety  - Thyroid Panel With TSH - FLUoxetine (PROZAC) 10 MG capsule; Take 1 capsule (10 mg total) by mouth daily.  Dispense: 30 capsule; Refill: 1 2. Other fatigue  - CBC - Vitamin D (25 hydroxy) - B12 and Folate Panel  3. Elevated blood pressure reading in office with diagnosis of hypertension Stop LoLoestrin. If BP does not return to normal over the weekend call PCP for medication management      Arlie Solomons B WHNP-BC 3:58 PM 06/23/2022

## 2022-06-24 ENCOUNTER — Other Ambulatory Visit: Payer: Self-pay | Admitting: Radiology

## 2022-06-24 ENCOUNTER — Other Ambulatory Visit: Payer: BC Managed Care – PPO

## 2022-06-24 DIAGNOSIS — R5383 Other fatigue: Secondary | ICD-10-CM

## 2022-06-24 LAB — THYROID PANEL WITH TSH
Free Thyroxine Index: 2.1 (ref 1.4–3.8)
T3 Uptake: 28 % (ref 22–35)
T4, Total: 7.5 ug/dL (ref 5.1–11.9)
TSH: 1.87 mIU/L

## 2022-06-24 LAB — VITAMIN D 25 HYDROXY (VIT D DEFICIENCY, FRACTURES): Vit D, 25-Hydroxy: 25 ng/mL — ABNORMAL LOW (ref 30–100)

## 2022-06-24 NOTE — Progress Notes (Signed)
Lab orders entered

## 2022-06-25 ENCOUNTER — Other Ambulatory Visit: Payer: BC Managed Care – PPO

## 2022-06-25 DIAGNOSIS — R5383 Other fatigue: Secondary | ICD-10-CM

## 2022-06-26 LAB — CBC
HCT: 34.2 % — ABNORMAL LOW (ref 35.0–45.0)
Hemoglobin: 12.1 g/dL (ref 11.7–15.5)
MCH: 35.3 pg — ABNORMAL HIGH (ref 27.0–33.0)
MCHC: 35.4 g/dL (ref 32.0–36.0)
MCV: 99.7 fL (ref 80.0–100.0)
MPV: 9.8 fL (ref 7.5–12.5)
Platelets: 228 10*3/uL (ref 140–400)
RBC: 3.43 10*6/uL — ABNORMAL LOW (ref 3.80–5.10)
RDW: 12.4 % (ref 11.0–15.0)
WBC: 3.8 10*3/uL (ref 3.8–10.8)

## 2022-06-26 LAB — B12 AND FOLATE PANEL
Folate: >24 ng/mL
Vitamin B-12: 680 pg/mL (ref 200–1100)

## 2022-06-28 ENCOUNTER — Other Ambulatory Visit: Payer: Self-pay

## 2022-06-28 DIAGNOSIS — D649 Anemia, unspecified: Secondary | ICD-10-CM

## 2022-06-28 DIAGNOSIS — R718 Other abnormality of red blood cells: Secondary | ICD-10-CM

## 2022-07-14 NOTE — Telephone Encounter (Signed)
Wellbutrin can often worse anxiety. I would recommend following up with her PCP. Also, what was the reaction to the prozac so we can add it to the chart.

## 2022-07-16 ENCOUNTER — Other Ambulatory Visit: Payer: Self-pay | Admitting: Radiology

## 2022-07-16 DIAGNOSIS — F419 Anxiety disorder, unspecified: Secondary | ICD-10-CM

## 2022-07-16 NOTE — Telephone Encounter (Signed)
Note attached to Rx"Pharmacy comment: REQUEST FOR 90 DAYS PRESCRIPTION. DX Code Needed" 

## 2022-07-21 ENCOUNTER — Ambulatory Visit: Payer: BC Managed Care – PPO | Admitting: Radiology

## 2023-02-03 ENCOUNTER — Other Ambulatory Visit: Payer: Self-pay | Admitting: Radiology

## 2023-12-29 ENCOUNTER — Ambulatory Visit: Payer: Self-pay | Admitting: Sports Medicine

## 2024-01-06 NOTE — Progress Notes (Signed)
 [facility] [address] [place] [address] Phone: [phone] Subjective:   Sheri Travis, am serving as a scribe for Dr. Arthea Travis.  I'm seeing this patient by the request  of:  Sheri Aldona CROME, NP  CC: Shoulder and knee pain  YEP:Dlagzrupcz  Sheri Travis is a 48 y.o. female coming in with complaint of R elbow and B knee pain. Patient states that she fell during the last snowfall. Pain in elbow over medial epicondyle. Arm aches throughout the day and falls asleep at night when elbow is flexed. Patient is R hand dominant.   B knee pain with sit to stand underneath patella. Working on [employer] which has improved some of her pain. Painful to do a deep squat.       Past Medical History:  Diagnosis Date   ADD (attention deficit disorder)    Anxiety    Past Surgical History:  Procedure Laterality Date   donate eggs     UPPER GASTROINTESTINAL ENDOSCOPY  2017   inflammation only-done in [place]   Social History   Socioeconomic History   Marital status: Married    Spouse name: Not on file   Number of children: Not on file   Years of education: Not on file   Highest education level: Not on file  Occupational History   Not on file  Tobacco Use   Smoking status: Former   Smokeless tobacco: Never  Vaping Use   Vaping status: Never Used  Substance and Sexual Activity   Alcohol use: Yes    Alcohol/week: 5.0 standard drinks of alcohol    Types: 5 Shots of liquor per week    Comment: 5 vodka per week per pt   Drug use: Yes    Frequency: 2.0 times per week    Types: Marijuana    Comment: last used, last night   Sexual activity: Yes    Partners: Male    Comment: partner vasectomy  Other Topics Concern   Not on file  Social History Narrative   Not on file   Social Drivers of Health   Financial Resource Strain: Low Risk  (02/01/2023)   Received from [facility], [facility]   Overall Financial Resource Strain  (CARDIA)    Difficulty of Paying Living Expenses: Not very hard  Food Insecurity: No Food Insecurity (02/01/2023)   Received from [facility], [facility]   Hunger Vital Sign    Worried About Running Out of Food in the Last Year: Never true    Ran Out of Food in the Last Year: Never true  Transportation Needs: No Transportation Needs (02/01/2023)   Received from [facility], [facility]   PRAPARE - Transportation    Lack of Transportation (Medical): No    Lack of Transportation (Non-Medical): No  Physical Activity: Insufficiently Active (02/01/2023)   Received from [facility], [facility]   Exercise Vital Sign    Days of Exercise per Week: 2 days    Minutes of Exercise per Session: 30 min  Stress: Stress Concern Present (02/01/2023)   Received from [facility], [facility] - Occupational Stress Questionnaire    Feeling of Stress : Rather much  Social Connections: Socially Integrated (02/01/2023)   Received from [facility], [facility]   Social Network    How would you rate your social network (family, work, friends)?: Good participation with social networks   Allergies  Allergen Reactions  Shrimp (Diagnostic) Other (See Comments)   Latex Itching    Reaction from latex condoms   Family History  Problem Relation Age of Onset   Hyperlipidemia Mother    Hypertension Mother    Heart disease Mother    Hyperlipidemia Father    Colon polyps Father    Aneurysm Maternal Grandmother    Diabetes Maternal Grandfather    Diabetes Paternal Grandmother    Heart attack Paternal Grandmother    Alzheimer's disease Paternal Grandfather    Colon cancer Neg Hx    Esophageal cancer Neg Hx    Rectal cancer Neg Hx    Stomach cancer Neg Hx      Current Outpatient Medications (Cardiovascular):    hydrochlorothiazide  (MICROZIDE ) 12.5 MG capsule, Take 1 capsule (12.5 mg total) by mouth daily.   lisinopril (ZESTRIL) 10 MG tablet,  Take 10 mg by mouth daily.    Current Outpatient Medications (Hematological):    Ferrous Sulfate (IRON PO), Take by mouth.  Current Outpatient Medications (Other):    b complex vitamins capsule, Take 1 capsule by mouth daily.   CALCIUM PO, Take by mouth.   COLLAGEN PO, Take by mouth.   gabapentin  (NEURONTIN ) 100 MG capsule, Take 2 capsules (200 mg total) by mouth at bedtime.   MAGNESIUM-POTASSIUM PO, Take by mouth.   Omega-3 Fatty Acids (FISH OIL PO), Take by mouth.   pantoprazole (PROTONIX) 20 MG tablet, Take 20 mg by mouth daily.   Reviewed prior external information including notes and imaging from  primary care provider As well as notes that were available from care everywhere and other healthcare systems.  Past medical history, social, surgical and family history all reviewed in electronic medical record.  No pertanent information unless stated regarding to the chief complaint.   Review of Systems:  No headache, visual changes, nausea, vomiting, diarrhea, constipation, dizziness, abdominal pain, skin rash, fevers, chills, night sweats, weight loss, swollen lymph nodes, body aches, joint swelling, chest pain, shortness of breath, mood changes. POSITIVE muscle aches  Objective  Blood pressure 112/72, pulse 65, height 5' (1.524 m), weight 134 lb (60.8 kg), last menstrual period 02/02/2022, SpO2 99%.   General: No apparent distress alert and oriented x3 mood and affect normal, dressed appropriately.  HEENT: Pupils equal, extraocular movements intact  Respiratory: Patient's speak in full sentences and does not appear short of breath  Cardiovascular: No lower extremity edema, non tender, no erythema  Elbow exam shows patient does have positive impingement noted.  Tender to palpation over the ulnar nerve today.  Positive Tinel's.  No pain over the radial head.  Full pronation and supination noted.  Knee exam shows patella alta noted.  Mild crepitus noted in right greater than left.   Patient has very mild lateral tracking noted.  Limited muscular skeletal ultrasound was performed and interpreted by Sheri Travis, M  Limited ultrasound shows subluxation of the ulnar nerve noted at the elbow.  Median nerve at the wrist appears to be unremarkable.    Impression and Recommendations:       The above documentation has been reviewed and is accurate and complete Ramzy Cappelletti M Breeze Angell, DO

## 2024-01-10 ENCOUNTER — Encounter: Payer: Self-pay | Admitting: Family Medicine

## 2024-01-10 ENCOUNTER — Ambulatory Visit (INDEPENDENT_AMBULATORY_CARE_PROVIDER_SITE_OTHER): Payer: 59

## 2024-01-10 ENCOUNTER — Ambulatory Visit: Payer: 59 | Admitting: Family Medicine

## 2024-01-10 VITALS — BP 112/72 | HR 65 | Ht 60.0 in | Wt 134.0 lb

## 2024-01-10 DIAGNOSIS — Q682 Congenital deformity of knee: Secondary | ICD-10-CM | POA: Insufficient documentation

## 2024-01-10 DIAGNOSIS — M25561 Pain in right knee: Secondary | ICD-10-CM

## 2024-01-10 DIAGNOSIS — M25521 Pain in right elbow: Secondary | ICD-10-CM

## 2024-01-10 DIAGNOSIS — G5621 Lesion of ulnar nerve, right upper limb: Secondary | ICD-10-CM

## 2024-01-10 DIAGNOSIS — G5620 Lesion of ulnar nerve, unspecified upper limb: Secondary | ICD-10-CM | POA: Insufficient documentation

## 2024-01-10 DIAGNOSIS — M25562 Pain in left knee: Secondary | ICD-10-CM

## 2024-01-10 MED ORDER — GABAPENTIN 100 MG PO CAPS: 200.0000 mg | ORAL_CAPSULE | Freq: Every day | ORAL | 0 refills | Status: AC

## 2024-01-10 NOTE — Patient Instructions (Addendum)
Exercises Xray today Gabapentin 200mg  at night Compression sleeve Avoid lying arm on hard surfaces Altra shoes See me again in 6-8 weeks

## 2024-01-10 NOTE — Assessment & Plan Note (Signed)
 Patella alta bilaterally.  Discussed with patient about icing regimen and home exercises, which activities to do and which ones to avoid.  Increase activity slowly.  Follow-up again in 6 to 8 weeks.  Worsening pain consider formal physical therapy and potential injection

## 2024-01-10 NOTE — Assessment & Plan Note (Signed)
 Ulnar entrapment  Do believe the patient did have some distal's association noted.  Discussed with patient about icing regimen and home exercises, discussed which activities to do and which ones to avoid.  Increase activity slowly.  We discussed compression and avoiding direct impact.  Gabapentin  given.  Warned of potential side effects.  Follow-up again in 6 to 8 weeks

## 2024-01-26 ENCOUNTER — Encounter: Payer: Self-pay | Admitting: Family Medicine

## 2024-02-23 ENCOUNTER — Ambulatory Visit: Payer: 59 | Admitting: Family Medicine

## 2024-04-05 NOTE — Progress Notes (Signed)
 [facility] [address] [place] [address] Phone: [phone] Subjective:   Sheri Travis, am serving as a scribe for Dr. Ronnell Travis.  I'm seeing this patient by the request  of:  Sheri Crease, NP  CC: Knee and elbow pain follow-up  BJY:[id number]  01/10/2024 Patella alta bilaterally. Discussed with patient about icing regimen and home exercises, which activities to do and which ones to avoid. Increase activity slowly. Follow-up again in 6 to 8 weeks. 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  Discussed with patient about icing regimen and home exercises, discussed which activities to do and which ones to avoid.  Increase activity slowly.  We discussed compression and avoiding direct impact.  Gabapentin  given.  Warned of potential side effects.  Follow-up again in 6 to 8 weeks     Updated 04/06/2024 Sheri Travis is a 48 y.o. female coming in with complaint of knee and elbow pain. Knee is doing well. R thumb pain for the last 2 weeks that is getting worse. Pain on palmer side of CMC joint also a little in IP joint. Ice does help, but doesn't ice consistently.      Past Medical History:  Diagnosis Date   ADD (attention deficit disorder)    Anxiety    Past Surgical History:  Procedure Laterality Date   donate eggs     UPPER GASTROINTESTINAL ENDOSCOPY  2017   inflammation only-done in [place]   Social History   Socioeconomic History   Marital status: Married    Spouse name: Not on file   Number of children: Not on file   Years of education: Not on file   Highest education level: Not on file  Occupational History   Not on file  Tobacco Use   Smoking status: Former   Smokeless tobacco: Never  Vaping Use   Vaping status: Never Used  Substance and Sexual Activity   Alcohol use: Yes    Alcohol/week: 5.0  standard drinks of alcohol    Types: 5 Shots of liquor per week    Comment: 5 vodka per week per pt   Drug use: Yes    Frequency: 2.0 times per week    Types: Marijuana    Comment: last used, last night   Sexual activity: Yes    Partners: Male    Comment: partner vasectomy  Other Topics Concern   Not on file  Social History Narrative   Not on file   Social Drivers of Health   Financial Resource Strain: Low Risk  (01/18/2024)   Received from [facility]   Overall Financial Resource Strain (CARDIA)    Difficulty of Paying Living Expenses: Not very hard  Food Insecurity: No Food Insecurity (01/18/2024)   [facility]   Hunger Vital Sign    Worried About Running Out of Food in the Last Year: Never true    Ran Out of Food in the Last Year: Never true  Transportation Needs: No Transportation Needs (01/18/2024)   Received from [facility] - Transportation    Lack of Transportation (Medical): No    Lack of Transportation (Non-Medical): No  Physical Activity: Insufficiently Active (01/18/2024)   Received from [facility]   Exercise Vital Sign    Days of Exercise per Week: 2 days    Minutes of Exercise per Session: 30 min  Stress:  Stress Concern Present (01/18/2024)   Received from [facility] of Occupational Health - Occupational Stress Questionnaire    Feeling of Stress : To some extent  Social Connections: Moderately Integrated (01/18/2024)   Received from [facility]   Social Network    How would you rate your social network (family, work, friends)?: Adequate participation with social networks   Allergies  Allergen Reactions   Shrimp (Diagnostic) Other (See Comments)   Latex Itching    Reaction from latex condoms   Family History  Problem Relation Age of Onset   Hyperlipidemia Mother    Hypertension Mother    Heart disease Mother    Hyperlipidemia Father    Colon polyps Father    Aneurysm Maternal Grandmother     Diabetes Maternal Grandfather    Diabetes Paternal Grandmother    Heart attack Paternal Grandmother    Alzheimer's disease Paternal Grandfather    Colon cancer Neg Hx    Esophageal cancer Neg Hx    Rectal cancer Neg Hx    Stomach cancer Neg Hx      Current Outpatient Medications (Cardiovascular):    hydrochlorothiazide  (MICROZIDE ) 12.5 MG capsule, Take 1 capsule (12.5 mg total) by mouth daily.   lisinopril (ZESTRIL) 10 MG tablet, Take 10 mg by mouth daily.    Current Outpatient Medications (Hematological):    Ferrous Sulfate (IRON PO), Take by mouth.  Current Outpatient Medications (Other):    b complex vitamins capsule, Take 1 capsule by mouth daily.   CALCIUM PO, Take by mouth.   COLLAGEN PO, Take by mouth.   gabapentin  (NEURONTIN ) 100 MG capsule, Take 2 capsules (200 mg total) by mouth at bedtime.   MAGNESIUM-POTASSIUM PO, Take by mouth.   Omega-3 Fatty Acids (FISH OIL PO), Take by mouth.   pantoprazole (PROTONIX) 20 MG tablet, Take 20 mg by mouth daily.   Reviewed prior external information including notes and imaging from  primary care provider As well as notes that were available from care everywhere and other healthcare systems.  Past medical history, social, surgical and family history all reviewed in electronic medical record.  No pertanent information unless stated regarding to the chief complaint.   Review of Systems:  No headache, visual changes, nausea, vomiting, diarrhea, constipation, dizziness, abdominal pain, skin rash, fevers, chills, night sweats, weight loss, swollen lymph nodes, body aches, joint swelling, chest pain, shortness of breath, mood changes. POSITIVE muscle aches  Objective  Blood pressure 126/86, pulse 72, height 5' (1.524 m), weight 133 lb (60.3 kg), SpO2 98%.   General: No apparent distress alert and oriented x3 mood and affect normal, dressed appropriately.  HEENT: Pupils equal, extraocular movements intact  Respiratory: Patient's speak  in full sentences and does not appear short of breath  Cardiovascular: No lower extremity edema, non tender, no erythema  Knee exam shows relatively good overall.  Full range of motion. Right thumb does have a trigger nodule noted in the A2 pulley.  Patient does have tenderness somewhat over the PIP as well.  Negative grind test of the [facility] joint  Procedure: Real-time Ultrasound Guided Injection of right thumb flexor tendon sheath Device: GE Logiq Q7 Ultrasound guided injection is preferred based studies that show increased duration, increased effect, greater accuracy, decreased procedural pain, increased response rate, and decreased cost with ultrasound guided versus blind injection.  Verbal informed consent obtained.  Time-out conducted.  Noted no overlying erythema, induration, or other signs of local infection.  Skin prepped in a  sterile fashion.  Local anesthesia: Topical Ethyl chloride.  With sterile technique and under real time ultrasound guidance: With a 25-gauge half inch needle injected with 0.5 cc of 0.5% Marcaine and 0.5 cc of Kenalog 40 mg/mL Completed without difficulty  Pain immediately resolved suggesting accurate placement of the medication.  Advised to call if fevers/chills, erythema, induration, drainage, or persistent bleeding.  Images saved Impression: Technically successful ultrasound guided injection.      Impression and Recommendations:     The above documentation has been reviewed and is accurate and complete Demiana Crumbley M Dayshaun Whobrey, DO

## 2024-04-06 ENCOUNTER — Encounter: Payer: Self-pay | Admitting: Family Medicine

## 2024-04-06 ENCOUNTER — Ambulatory Visit (INDEPENDENT_AMBULATORY_CARE_PROVIDER_SITE_OTHER)

## 2024-04-06 ENCOUNTER — Other Ambulatory Visit: Payer: Self-pay

## 2024-04-06 ENCOUNTER — Ambulatory Visit: Admitting: Family Medicine

## 2024-04-06 VITALS — BP 126/86 | HR 72 | Ht 60.0 in | Wt 133.0 lb

## 2024-04-06 DIAGNOSIS — M79644 Pain in right finger(s): Secondary | ICD-10-CM

## 2024-04-06 DIAGNOSIS — M65311 Trigger thumb, right thumb: Secondary | ICD-10-CM | POA: Diagnosis not present

## 2024-04-06 NOTE — Assessment & Plan Note (Signed)
 Patient given injection, discussed bracing at night, icing regimen.  X-rays do show a potential for an endochondroma.  Do not think any advanced imaging is warranted.  Follow-up with me again in 6 to 8 weeks's.

## 2024-04-06 NOTE — Patient Instructions (Signed)
 Injection in thumb today Good to see you! Frog splint, heat and massage See you again in 2-3 months

## 2024-04-09 ENCOUNTER — Encounter: Payer: Self-pay | Admitting: Family Medicine

## 2024-06-06 ENCOUNTER — Ambulatory Visit: Admitting: Family Medicine

## 2024-08-29 NOTE — Progress Notes (Deleted)
 [name] [address] [place] [address] Phone: [phone] Subjective:    I'm seeing this patient by the request  of:  [name], NP  CC: Thumb pain  YEP:Dlagzrupcz  04/06/2024 Patient given injection, discussed bracing at night, icing regimen. X-rays do show a potential for an endochondroma. Do not think any advanced imaging is warranted. Follow-up with me again in 6 to 8 weeks's.   Update 08/30/2024 Sheri Travis is a 48 y.o. female coming in with complaint of R thumb pain. Injected last visit for trigger thumb. 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     Past Medical History:  Diagnosis Date   ADD (attention deficit disorder)    Anxiety    Past Surgical History:  Procedure Laterality Date   donate eggs     UPPER GASTROINTESTINAL ENDOSCOPY  2017   inflammation only-done in [place]   Social History   Socioeconomic History   Marital status: Married    Spouse name: Not on file   Number of children: Not on file   Years of education: Not on file   Highest education level: Not on file  Occupational History   Not on file  Tobacco Use   Smoking status: Former   Smokeless tobacco: Never  Vaping Use   Vaping status: Never Used  Substance and Sexual Activity   Alcohol use: Yes    Alcohol/week: 5.0 standard drinks of alcohol    Types: 5 Shots of liquor per week    Comment: 5 vodka per week per pt   Drug use: Yes    Frequency: 2.0 times per week    Types: Marijuana    Comment: last used, last night   Sexual activity: Yes    Partners: Male    Comment: partner vasectomy  Other Topics Concern   Not on file  Social History Narrative   Not on file   Social Drivers of Health   Financial Resource Strain: Low Risk  (01/18/2024)   Received from [facility]   Overall Financial Resource Strain (CARDIA)    Difficulty of Paying Living Expenses: Not very hard  Food Insecurity: No Food Insecurity (01/18/2024)   Received [facility]   Hunger Vital Sign    Within the past 12 months, you worried that your food would run out before you got the money to buy more.: Never true    Within the past 12 months, the food you bought just didn't last and you didn't have money to get more.: Never true  Transportation Needs: No Transportation Needs (01/18/2024)   Received from [facility] - Transportation    Lack of Transportation (Medical): No    Lack of Transportation (Non-Medical): No  Physical Activity: Insufficiently Active (01/18/2024)   Received from [facility]   Exercise Vital Sign    On average, how many days per week do you engage in moderate to strenuous exercise (like a brisk walk)?: 2 days    On average, how many minutes do you engage in exercise at this level?: 30 min  Stress: Stress Concern Present (01/18/2024)   Received from [facility] of Occupational Health - Occupational Stress Questionnaire    Feeling of Stress : To some extent  Social Connections:  Moderately Integrated (01/18/2024)   Received from [facility]   Social Network    How would you rate your social network (family, work, friends)?: Adequate participation with social networks   Allergies  Allergen Reactions   Shrimp (Diagnostic) Other (See Comments)   Latex Itching    Reaction from latex condoms   Family History  Problem Relation Age of Onset   Hyperlipidemia Mother    Hypertension Mother    Heart disease Mother    Hyperlipidemia Father    Colon polyps Father    Aneurysm Maternal Grandmother    Diabetes Maternal Grandfather    Diabetes Paternal Grandmother    Heart attack Paternal Grandmother    Alzheimer's disease Paternal Grandfather    Colon cancer Neg Hx    Esophageal cancer Neg Hx    Rectal cancer Neg Hx     Stomach cancer Neg Hx      Current Outpatient Medications (Cardiovascular):    hydrochlorothiazide  (MICROZIDE ) 12.5 MG capsule, Take 1 capsule (12.5 mg total) by mouth daily.   lisinopril (ZESTRIL) 10 MG tablet, Take 10 mg by mouth daily.    Current Outpatient Medications (Hematological):    Ferrous Sulfate (IRON PO), Take by mouth.  Current Outpatient Medications (Other):    b complex vitamins capsule, Take 1 capsule by mouth daily.   CALCIUM PO, Take by mouth.   COLLAGEN PO, Take by mouth.   gabapentin  (NEURONTIN ) 100 MG capsule, Take 2 capsules (200 mg total) by mouth at bedtime.   MAGNESIUM-POTASSIUM PO, Take by mouth.   Omega-3 Fatty Acids (FISH OIL PO), Take by mouth.   pantoprazole (PROTONIX) 20 MG tablet, Take 20 mg by mouth daily.   Reviewed prior external information including notes and imaging from  primary care provider As well as notes that were available from care everywhere and other healthcare systems.  Past medical history, social, surgical and family history all reviewed in electronic medical record.  No pertanent information unless stated regarding to the chief complaint.   Review of Systems:  No headache, visual changes, nausea, vomiting, diarrhea, constipation, dizziness, abdominal pain, skin rash, fevers, chills, night sweats, weight loss, swollen lymph nodes, body aches, joint swelling, chest pain, shortness of breath, mood changes. POSITIVE muscle aches  Objective  There were no vitals taken for this visit.   General: No apparent distress alert and oriented x3 mood and affect normal, dressed appropriately.  HEENT: Pupils equal, extraocular movements intact  Respiratory: Patient's speak in full sentences and does not appear short of breath  Cardiovascular: No lower extremity edema, non tender, no erythema  Thumb exam shows  Limited muscular skeletal ultrasound was performed and interpreted by CLAUDENE HUSSAR, M      Impression and Recommendations:     The above documentation has been reviewed and is accurate and complete Aspynn Clover M Emme Rosenau, DO

## 2024-08-30 ENCOUNTER — Ambulatory Visit: Admitting: Family Medicine

## 2024-09-05 NOTE — Progress Notes (Unsigned)
 Sheri Travis [facility] [address] [place] [address] Phone: [phone] Subjective:   Sheri Travis, am serving as a scribe for Dr. Arthea Travis.  I'm seeing this patient by the request  of:  White, Sheri CROME, NP  CC: Continued thumb pain  YEP:Dlagzrupcz  Sheri Travis is a 48 y.o. female coming in with complaint of right thumb pain.  Found to have a trigger thumb, given injection a second.  Patient states that injection was helpful.       Past Medical History:  Diagnosis Date   ADD (attention deficit disorder)    Anxiety    Past Surgical History:  Procedure Laterality Date   donate eggs     UPPER GASTROINTESTINAL ENDOSCOPY  2017   inflammation only-done in [place]   Social History   Socioeconomic History   Marital status: Married    Spouse name: Not on file   Number of children: Not on file   Years of education: Not on file   Highest education level: Not on file  Occupational History   Not on file  Tobacco Use   Smoking status: Former   Smokeless tobacco: Never  Vaping Use   Vaping status: Never Used  Substance and Sexual Activity   Alcohol use: Yes    Alcohol/week: 5.0 standard drinks of alcohol    Types: 5 Shots of liquor per week    Comment: 5 vodka per week per pt   Drug use: Yes    Frequency: 2.0 times per week    Types: Marijuana    Comment: last used, last night   Sexual activity: Yes    Partners: Male    Comment: partner vasectomy  Other Topics Concern   Not on file  Social History Narrative   Not on file   Social Drivers of Health   Financial Resource Strain: Low Risk  (01/18/2024)   Received from [facility]   Overall Financial Resource Strain (CARDIA)    Difficulty of Paying Living Expenses: Not very hard  Food Insecurity: No Food Insecurity (01/18/2024)   Received from [facility]   Hunger Vital Sign    Within the past 12 months, you worried that your food would run out before you got the  money to buy more.: Never true    Within the past 12 months, the food you bought just didn't last and you didn't have money to get more.: Never true  Transportation Needs: No Transportation Needs (01/18/2024)   Received from [facility] - Transportation    Lack of Transportation (Medical): No    Lack of Transportation (Non-Medical): No  Physical Activity: Insufficiently Active (01/18/2024)   Received from [facility]   Exercise Vital Sign    On average, how many days per week do you engage in moderate to strenuous exercise (like a brisk walk)?: 2 days    On average, how many minutes do you engage in exercise at this level?: 30 min  Stress: Stress Concern Present (01/18/2024)   Received from [facility] - Occupational Stress Questionnaire    Feeling of Stress : To some extent  Social Connections: Moderately Integrated (01/18/2024)   Received from [facility]   Social Network    How would you rate your social network (family, work, friends)?: Adequate participation with social networks   Allergies  Allergen Reactions   Shrimp (Diagnostic) Other (See Comments)   Latex Itching  Reaction from latex condoms   Family History  Problem Relation Age of Onset   Hyperlipidemia Mother    Hypertension Mother    Heart disease Mother    Hyperlipidemia Father    Colon polyps Father    Aneurysm Maternal Grandmother    Diabetes Maternal Grandfather    Diabetes Paternal Grandmother    Heart attack Paternal Grandmother    Alzheimer's disease Paternal Grandfather    Colon cancer Neg Hx    Esophageal cancer Neg Hx    Rectal cancer Neg Hx    Stomach cancer Neg Hx      Current Outpatient Medications (Cardiovascular):    hydrochlorothiazide  (MICROZIDE ) 12.5 MG capsule, Take 1 capsule (12.5 mg total) by mouth daily.   lisinopril (ZESTRIL) 10 MG tablet, Take 10 mg by mouth daily.    Current Outpatient Medications (Hematological):     Ferrous Sulfate (IRON PO), Take by mouth.  Current Outpatient Medications (Other):    b complex vitamins capsule, Take 1 capsule by mouth daily.   CALCIUM PO, Take by mouth.   COLLAGEN PO, Take by mouth.   gabapentin  (NEURONTIN ) 100 MG capsule, Take 2 capsules (200 mg total) by mouth at bedtime.   MAGNESIUM-POTASSIUM PO, Take by mouth.   Omega-3 Fatty Acids (FISH OIL PO), Take by mouth.   pantoprazole (PROTONIX) 20 MG tablet, Take 20 mg by mouth daily.   Reviewed prior external information including notes and imaging from  primary care provider As well as notes that were available from care everywhere and other healthcare systems.  Past medical history, social, surgical and family history all reviewed in electronic medical record.  No pertanent information unless stated regarding to the chief complaint.   Review of Systems:  No headache, visual changes, nausea, vomiting, diarrhea, constipation, dizziness, abdominal pain, skin rash, fevers, chills, night sweats, weight loss, swollen lymph nodes, body aches, joint swelling, chest pain, shortness of breath, mood changes. POSITIVE muscle aches  Objective  There were no vitals taken for this visit.   General: No apparent distress alert and oriented x3 mood and affect normal, dressed appropriately.  HEENT: Pupils equal, extraocular movements intact  Respiratory: Patient's speak in full sentences and does not appear short of breath  Cardiovascular: No lower extremity edema, non tender, no erythema  Right hand exam shows the patient does have tenderness to palpation over the A2 pulley on the thumb.  Triggering noted.  Tender to palpation in this area.  Neurovascular intact distally.  Limited muscular skeletal ultrasound was performed and interpreted by Travis HUSSAR, M  Limited ultrasound shows hypoechoic changes involving the tendon sheath of the flexor tendon.  Procedure: Real-time Ultrasound Guided Injection of right thumb flexor tendon  sheath Device: GE Logiq Q7 Ultrasound guided injection is preferred based studies that show increased duration, increased effect, greater accuracy, decreased procedural pain, increased response rate, and decreased cost with ultrasound guided versus blind injection.  Verbal informed consent obtained.  Time-out conducted.  Noted no overlying erythema, induration, or other signs of local infection.  Skin prepped in a sterile fashion.  Local anesthesia: Topical Ethyl chloride.  With sterile technique and under real time ultrasound guidance: With a 25-gauge half inch needle injected with 0.5 cc of 0.5% Marcaine and 0.5 cc of Kenalog 40 mg/mL Completed without difficulty  Pain immediately resolved suggesting accurate placement of the medication.  Advised to call if fevers/chills, erythema, induration, drainage, or persistent bleeding.  Images saved Impression: Technically successful ultrasound guided injection.    Impression  and Recommendations:     The above documentation has been reviewed and is accurate and complete Kandon Hosking M Decorey Wahlert, DO

## 2024-09-06 ENCOUNTER — Ambulatory Visit: Admitting: Family Medicine

## 2024-09-06 ENCOUNTER — Encounter: Payer: Self-pay | Admitting: Family Medicine

## 2024-09-06 ENCOUNTER — Other Ambulatory Visit: Payer: Self-pay

## 2024-09-06 VITALS — BP 142/98 | HR 62 | Ht 60.0 in | Wt 138.0 lb

## 2024-09-06 DIAGNOSIS — M79644 Pain in right finger(s): Secondary | ICD-10-CM | POA: Diagnosis not present

## 2024-09-06 DIAGNOSIS — M65311 Trigger thumb, right thumb: Secondary | ICD-10-CM | POA: Diagnosis not present

## 2024-09-06 DIAGNOSIS — G8929 Other chronic pain: Secondary | ICD-10-CM | POA: Diagnosis not present

## 2024-09-06 NOTE — Patient Instructions (Signed)
 Injected R thumb

## 2024-09-06 NOTE — Assessment & Plan Note (Signed)
 Repeat injection given today, discussed we do not want to repeat in 2 regularly if possible.  Discussed icing regimen and home exercises, increase activity slowly.  Follow-up again in 6 to 8 weeks
